# Patient Record
Sex: Male | Born: 1960 | Race: White | Hispanic: No | Marital: Married | Smoking: Current some day smoker
Health system: Southern US, Community
[De-identification: ages and names within clinical notes are randomized; demographics above are authoritative.]

## PROBLEM LIST (undated history)

## (undated) HISTORY — PX: WRIST SURGERY: SHX841

## (undated) HISTORY — PX: APPENDECTOMY: SHX54

---

## 1998-02-02 ENCOUNTER — Ambulatory Visit (HOSPITAL_COMMUNITY): Admission: RE | Admit: 1998-02-02 | Discharge: 1998-02-02 | Payer: Self-pay | Admitting: Orthopedic Surgery

## 1998-12-13 ENCOUNTER — Ambulatory Visit (HOSPITAL_COMMUNITY): Admission: RE | Admit: 1998-12-13 | Discharge: 1998-12-13 | Payer: Self-pay | Admitting: Orthopedic Surgery

## 1998-12-13 ENCOUNTER — Encounter: Payer: Self-pay | Admitting: Orthopedic Surgery

## 1999-01-14 ENCOUNTER — Emergency Department (HOSPITAL_COMMUNITY): Admission: EM | Admit: 1999-01-14 | Discharge: 1999-01-14 | Payer: Self-pay | Admitting: Emergency Medicine

## 1999-11-19 ENCOUNTER — Emergency Department (HOSPITAL_COMMUNITY): Admission: EM | Admit: 1999-11-19 | Discharge: 1999-11-19 | Payer: Self-pay | Admitting: Emergency Medicine

## 2001-09-26 ENCOUNTER — Emergency Department (HOSPITAL_COMMUNITY): Admission: EM | Admit: 2001-09-26 | Discharge: 2001-09-26 | Payer: Self-pay | Admitting: Emergency Medicine

## 2003-11-08 ENCOUNTER — Observation Stay (HOSPITAL_COMMUNITY): Admission: EM | Admit: 2003-11-08 | Discharge: 2003-11-09 | Payer: Self-pay

## 2008-04-17 ENCOUNTER — Emergency Department (HOSPITAL_BASED_OUTPATIENT_CLINIC_OR_DEPARTMENT_OTHER): Admission: EM | Admit: 2008-04-17 | Discharge: 2008-04-17 | Payer: Self-pay | Admitting: Emergency Medicine

## 2008-10-12 ENCOUNTER — Encounter: Admission: RE | Admit: 2008-10-12 | Discharge: 2008-10-12 | Payer: Self-pay | Admitting: Geriatric Medicine

## 2014-02-08 ENCOUNTER — Encounter (HOSPITAL_BASED_OUTPATIENT_CLINIC_OR_DEPARTMENT_OTHER): Payer: Self-pay | Admitting: Emergency Medicine

## 2014-02-08 ENCOUNTER — Emergency Department (HOSPITAL_BASED_OUTPATIENT_CLINIC_OR_DEPARTMENT_OTHER)
Admission: EM | Admit: 2014-02-08 | Discharge: 2014-02-08 | Disposition: A | Discharge status: Home / Self Care | Payer: BC Managed Care – PPO | Attending: Emergency Medicine | Admitting: Emergency Medicine

## 2014-02-08 ENCOUNTER — Emergency Department (HOSPITAL_BASED_OUTPATIENT_CLINIC_OR_DEPARTMENT_OTHER): Payer: BC Managed Care – PPO

## 2014-02-08 DIAGNOSIS — Z792 Long term (current) use of antibiotics: Secondary | ICD-10-CM | POA: Insufficient documentation

## 2014-02-08 DIAGNOSIS — L089 Local infection of the skin and subcutaneous tissue, unspecified: Secondary | ICD-10-CM

## 2014-02-08 DIAGNOSIS — F17200 Nicotine dependence, unspecified, uncomplicated: Secondary | ICD-10-CM | POA: Insufficient documentation

## 2014-02-08 DIAGNOSIS — L988 Other specified disorders of the skin and subcutaneous tissue: Secondary | ICD-10-CM | POA: Insufficient documentation

## 2014-02-08 MED ORDER — MELOXICAM 15 MG PO TABS: 15.0000 mg | ORAL_TABLET | Freq: Every day | ORAL | Status: AC

## 2014-02-08 MED ORDER — CIPROFLOXACIN HCL 500 MG PO TABS: 500.0000 mg | ORAL_TABLET | Freq: Two times a day (BID) | ORAL | Status: AC

## 2014-02-08 MED ORDER — KETOROLAC TROMETHAMINE 60 MG/2ML IM SOLN
60.0000 mg | Freq: Once | INTRAMUSCULAR | Status: AC
  Administered 2014-02-08: 60 mg via INTRAMUSCULAR
  Filled 2014-02-08: qty 2

## 2014-02-08 MED ORDER — CIPROFLOXACIN HCL 500 MG PO TABS
500.0000 mg | ORAL_TABLET | Freq: Once | ORAL | Status: AC
  Administered 2014-02-08: 500 mg via ORAL
  Filled 2014-02-08: qty 1

## 2014-02-08 MED ORDER — TRAMADOL HCL 50 MG PO TABS: 50.0000 mg | ORAL_TABLET | Freq: Four times a day (QID) | ORAL | Status: AC | PRN

## 2014-02-08 NOTE — ED Notes (Signed)
Rolled his right great toe over a week ago at the beach while playing volleyball.  Progressive redness, swelling, and pain.  Seen by PMD yesterday who did an I and D, prescribed antibiotic.  Redness, pain is progressing and he has increased concern.

## 2014-02-08 NOTE — ED Notes (Signed)
D/c home with rx x 3- pt has a ride

## 2014-02-08 NOTE — ED Provider Notes (Signed)
CSN: [id number]     Arrival date & time 02/08/14  0029 History   First MD Initiated Contact with Patient 02/08/14 0043     Chief Complaint  Patient presents with  . Foot Pain     (Consider location/radiation/quality/duration/timing/severity/associated sxs/prior Treatment) Patient is a 53 y.o. male presenting with lower extremity pain. The history is provided by the patient. No language interpreter was used.  Foot Pain This is a new problem. The current episode started more than 1 week ago. The problem has been gradually worsening. Pertinent negatives include no chest pain, no abdominal pain, no headaches and no shortness of breath. Nothing aggravates the symptoms. Nothing relieves the symptoms. Treatments tried: ibuprofen and keflex x 24 hours. The treatment provided no relief.  States right great tow pain started over a week ago at beach no openings in the skin he is aware of.  Seen at [facility] yesterday and had paronychia or great toe drained and started on keflex.  States swelling and pain is worse  History reviewed. No pertinent past medical history. Past Surgical History  Procedure Laterality Date  . Wrist surgery    . Appendectomy     No family history on file. History  Substance Use Topics  . Smoking status: Current Some Day Smoker  . Smokeless tobacco: Not on file  . Alcohol Use: Yes    Review of Systems  Constitutional: Negative for fever.  Respiratory: Negative for shortness of breath.   Cardiovascular: Negative for chest pain.  Gastrointestinal: Negative for abdominal pain.  Neurological: Negative for headaches.  All other systems reviewed and are negative.     Allergies  Codeine and Oxycodone  Home Medications   Prior to Admission medications   Medication Sig Start Date End Date Taking? Authorizing Provider  cephALEXin (KEFLEX) 500 MG capsule Take 500 mg by mouth 3 (three) times daily.   Yes Historical Provider, MD   BP 164/102  Pulse 77  Temp(Src) 97.6 F  (36.4 C) (Oral)  Resp 16  Ht 5\' 9"  (1.753 m)  Wt 190 lb (86.183 kg)  BMI 28.05 kg/m2  SpO2 98% Physical Exam  Constitutional: He is oriented to person, place, and time. He appears well-developed and well-nourished. No distress.  HENT:  Head: Normocephalic and atraumatic.  Mouth/Throat: Oropharynx is clear and moist.  Eyes: Conjunctivae are normal. Pupils are equal, round, and reactive to light.  Neck: Normal range of motion. Neck supple.  Cardiovascular: Normal rate, regular rhythm and intact distal pulses.   Pulmonary/Chest: Effort normal. He has no wheezes. He has no rales.  Abdominal: Soft. Bowel sounds are normal. There is no tenderness. There is no rebound and no guarding.  Musculoskeletal: Normal range of motion.       Right foot: He exhibits no bony tenderness, no swelling, normal capillary refill, no crepitus, no deformity and no laceration.       Feet:  No fluctuance no streaking.  No paronychia  Neurological: He is alert and oriented to person, place, and time. He has normal reflexes.  Skin: Skin is warm and dry.  Psychiatric: He has a normal mood and affect.    ED Course  Procedures (including critical care time) Labs Review Labs Reviewed - No data to display  Imaging Review Dg Foot Complete Right  02/08/2014   CLINICAL DATA:  Right foot redness, swelling, pain  EXAM: RIGHT FOOT COMPLETE - 3+ VIEW  COMPARISON:  None.  FINDINGS: No fracture or dislocation is seen.  Mild degenerative changes of  the 1st MTP joint.  Mild soft tissue swelling along the medial aspect of the 1st digit.  IMPRESSION: No acute osseous abnormality is seen.   Electronically Signed   By: Julian Hy M.D.   On: 02/08/2014 02:08     EKG Interpretation None      MDM   Final diagnoses:  None    Patient had paronychia drained yesterday at [facility].  Culture apparently sent. Nothing to drain today minimal warmth and swelling at lateral first MTP.   Patient placed on keflex has only had 24  hours worth at this point.  As patient was at the beach and in the water and also wearing flip flops will add cipro BID x 7 days as this will cover sand and ocean flora and pseudomonas.  Continue keflex.  Follow up with your PMD on Monday return for fevers > 101, streaking up the leg vomiting or weakness.  Have given referral to orthopedics for prn follow up.  Also return for hives or swelling or shortness of breath as tramadol is a cousin drug to percocet.  Patient states he will tolerate itching for relief of pain.  Low risk for severe allergic reaction.      Carlisle Beers, MD 02/08/14 [phone]

## 2017-09-03 DIAGNOSIS — G4733 Obstructive sleep apnea (adult) (pediatric): Secondary | ICD-10-CM | POA: Diagnosis not present

## 2017-09-28 DIAGNOSIS — L821 Other seborrheic keratosis: Secondary | ICD-10-CM | POA: Diagnosis not present

## 2017-09-28 DIAGNOSIS — L7211 Pilar cyst: Secondary | ICD-10-CM | POA: Diagnosis not present

## 2017-09-28 DIAGNOSIS — Z85828 Personal history of other malignant neoplasm of skin: Secondary | ICD-10-CM | POA: Diagnosis not present

## 2018-02-18 DIAGNOSIS — G4733 Obstructive sleep apnea (adult) (pediatric): Secondary | ICD-10-CM | POA: Diagnosis not present

## 2018-05-02 DIAGNOSIS — Z86010 Personal history of colonic polyps: Secondary | ICD-10-CM | POA: Diagnosis not present

## 2018-05-02 DIAGNOSIS — K5730 Diverticulosis of large intestine without perforation or abscess without bleeding: Secondary | ICD-10-CM | POA: Diagnosis not present

## 2018-05-02 DIAGNOSIS — D126 Benign neoplasm of colon, unspecified: Secondary | ICD-10-CM | POA: Diagnosis not present

## 2018-05-18 DIAGNOSIS — Z6827 Body mass index (BMI) 27.0-27.9, adult: Secondary | ICD-10-CM | POA: Diagnosis not present

## 2018-05-18 DIAGNOSIS — H109 Unspecified conjunctivitis: Secondary | ICD-10-CM | POA: Diagnosis not present

## 2018-06-13 DIAGNOSIS — I10 Essential (primary) hypertension: Secondary | ICD-10-CM | POA: Diagnosis not present

## 2018-06-13 DIAGNOSIS — M10472 Other secondary gout, left ankle and foot: Secondary | ICD-10-CM | POA: Diagnosis not present

## 2018-06-13 DIAGNOSIS — E785 Hyperlipidemia, unspecified: Secondary | ICD-10-CM | POA: Diagnosis not present

## 2018-08-19 ENCOUNTER — Other Ambulatory Visit: Payer: Self-pay | Admitting: Nephrology

## 2018-08-19 DIAGNOSIS — R319 Hematuria, unspecified: Secondary | ICD-10-CM

## 2018-08-19 DIAGNOSIS — N183 Chronic kidney disease, stage 3 unspecified: Secondary | ICD-10-CM

## 2018-08-26 ENCOUNTER — Other Ambulatory Visit: Payer: Self-pay

## 2018-08-27 ENCOUNTER — Ambulatory Visit
Admission: RE | Admit: 2018-08-27 | Discharge: 2018-08-27 | Disposition: A | Discharge status: Home / Self Care | Payer: Self-pay | Source: Ambulatory Visit | Attending: Nephrology | Admitting: Nephrology

## 2018-08-27 DIAGNOSIS — N183 Chronic kidney disease, stage 3 unspecified: Secondary | ICD-10-CM

## 2018-08-27 DIAGNOSIS — R319 Hematuria, unspecified: Secondary | ICD-10-CM

## 2018-08-29 ENCOUNTER — Other Ambulatory Visit: Payer: Self-pay

## 2019-01-08 IMAGING — US US RENAL
1 series · 14 of 25 positions shown · non-contrast
Comparison: CT 04/17/2008

CLINICAL DATA: Chronic kidney disease with hematuria

EXAM:
RENAL / URINARY TRACT ULTRASOUND COMPLETE

[Series 1: us renal · 0.25mm/px · 14 of 39 slices shown]
[im 1/39]
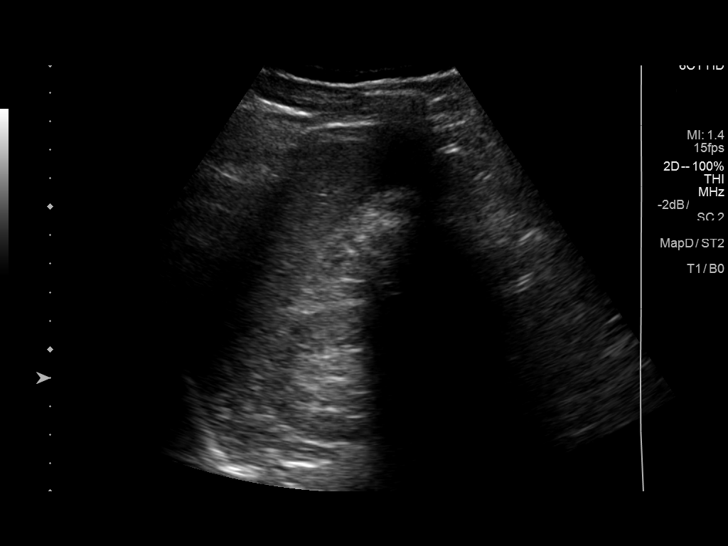
[im 4/39]
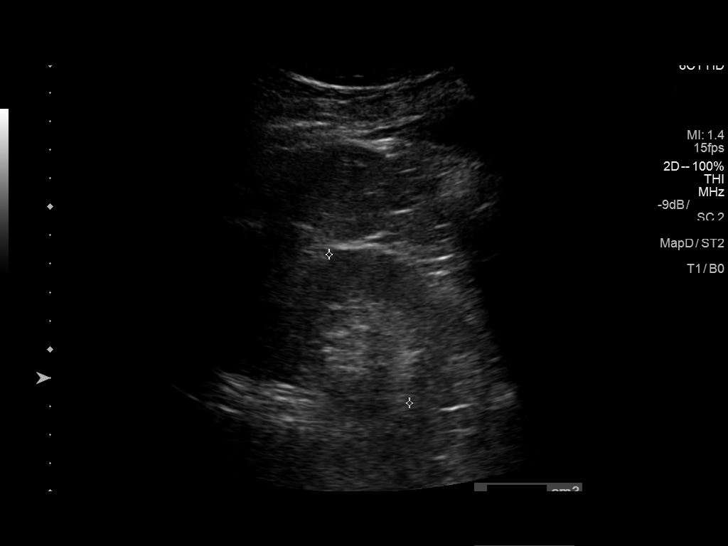
[im 7/39]
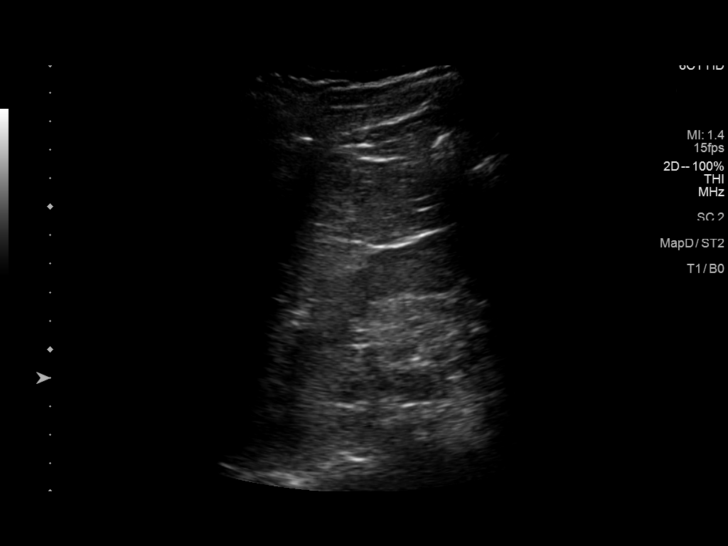
[im 10/39]
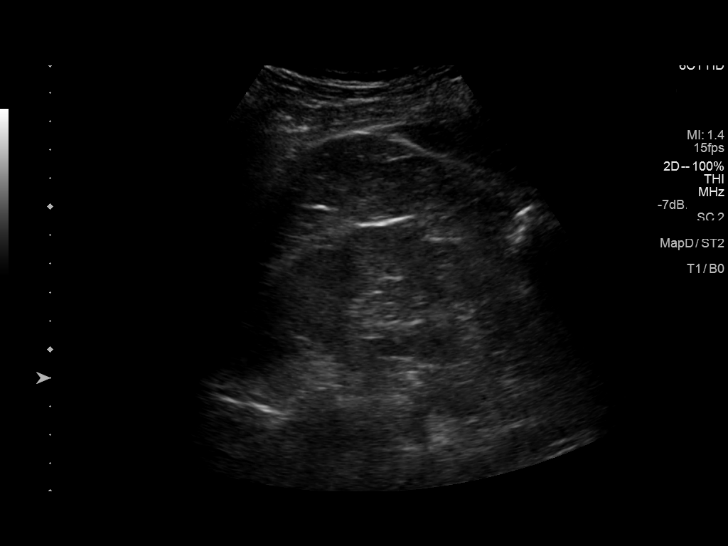
[im 13/39]
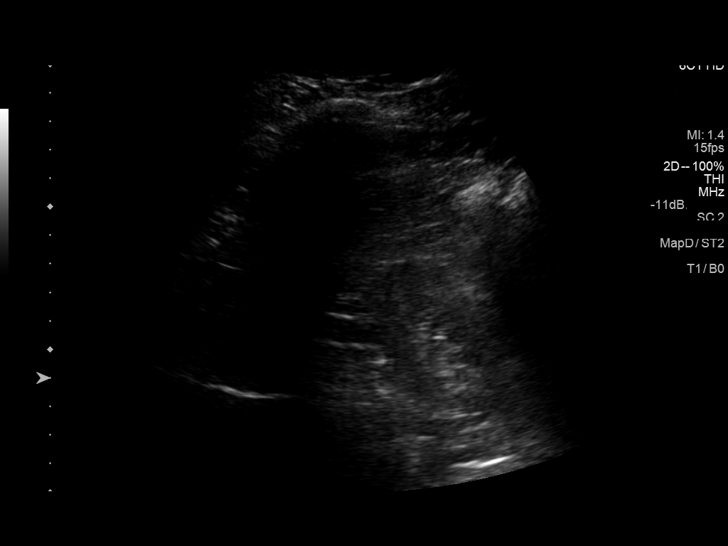
[im 15/39]
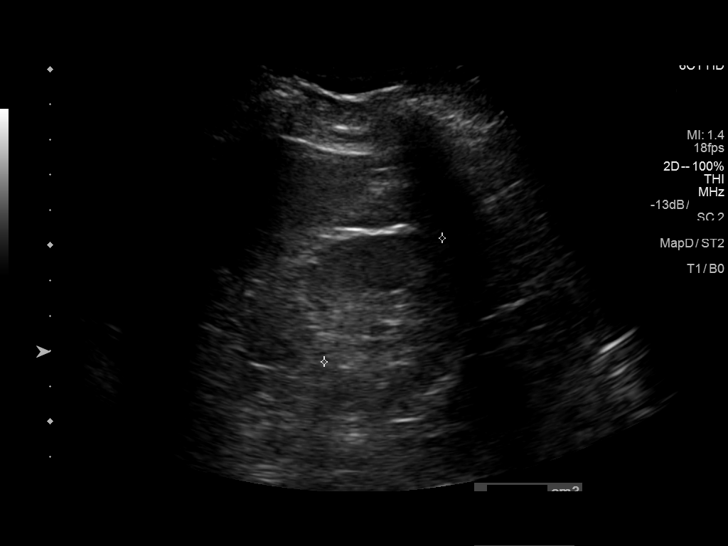
[im 18/39]
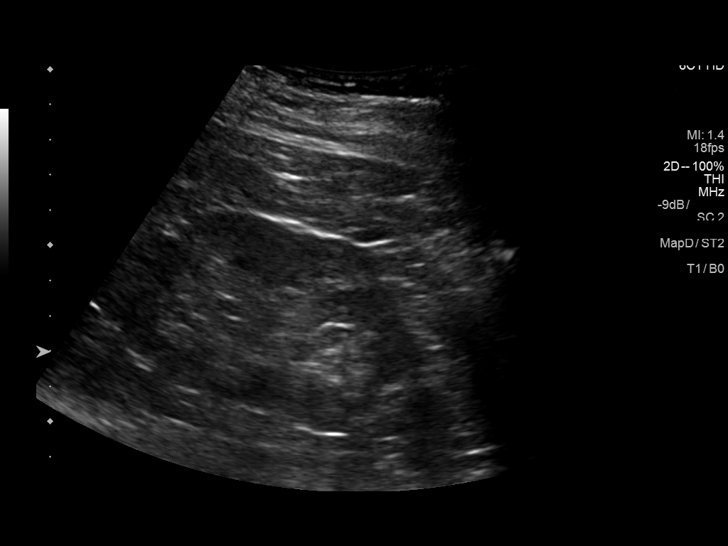
[im 21/39]
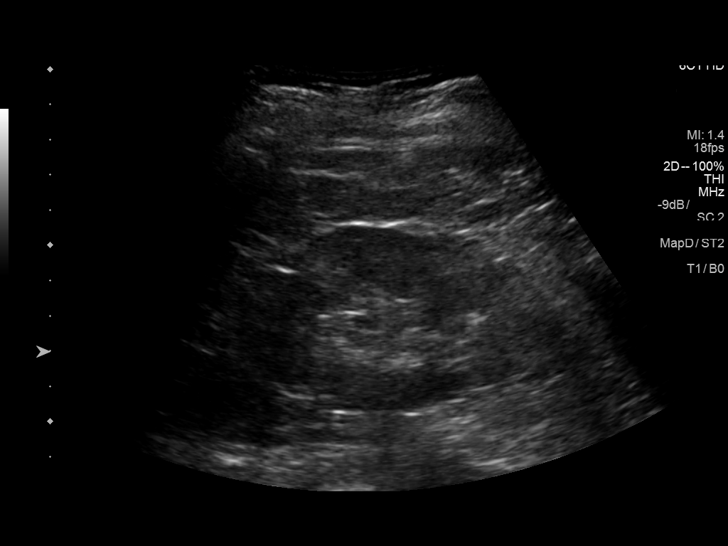
[im 24/39]
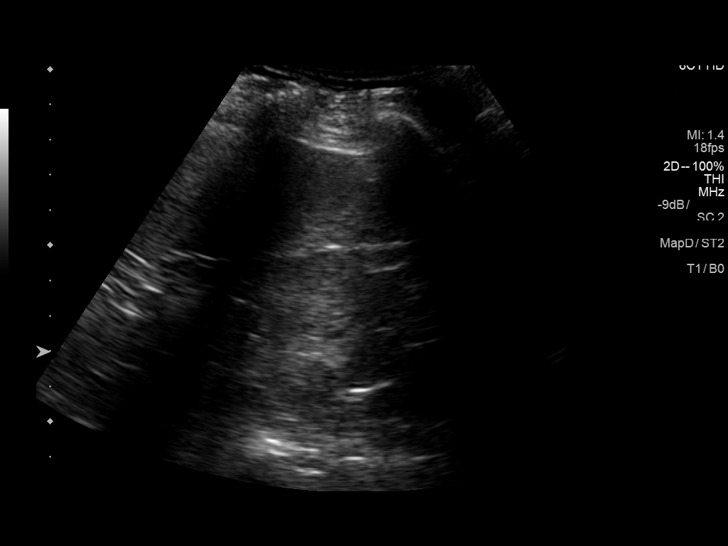
[im 26/39]
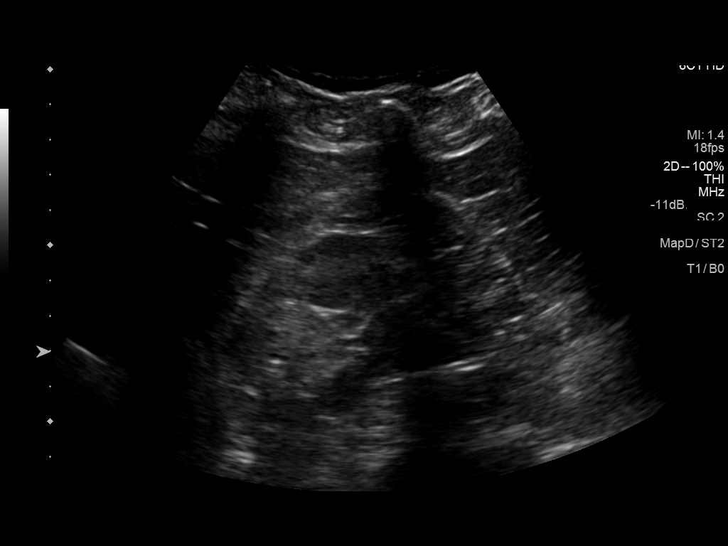
[im 29/39]
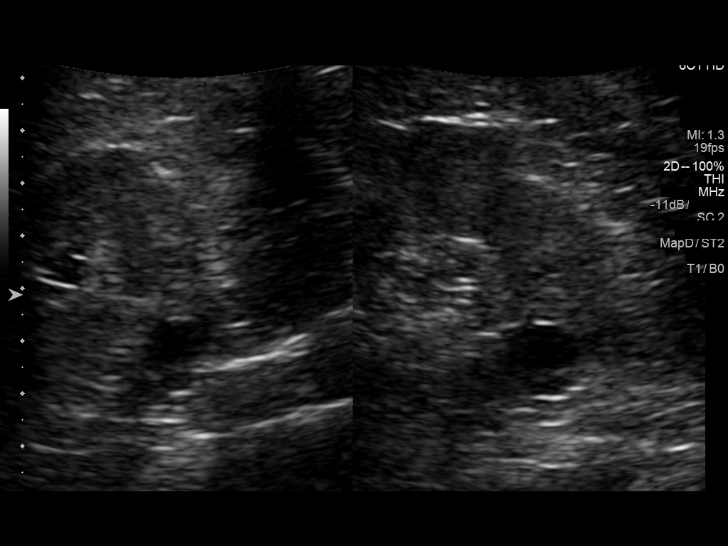
[im 32/39]
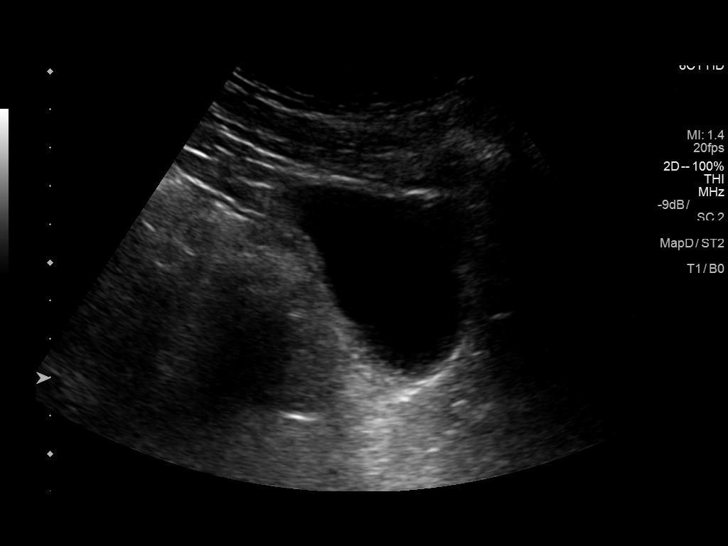
[im 35/39]
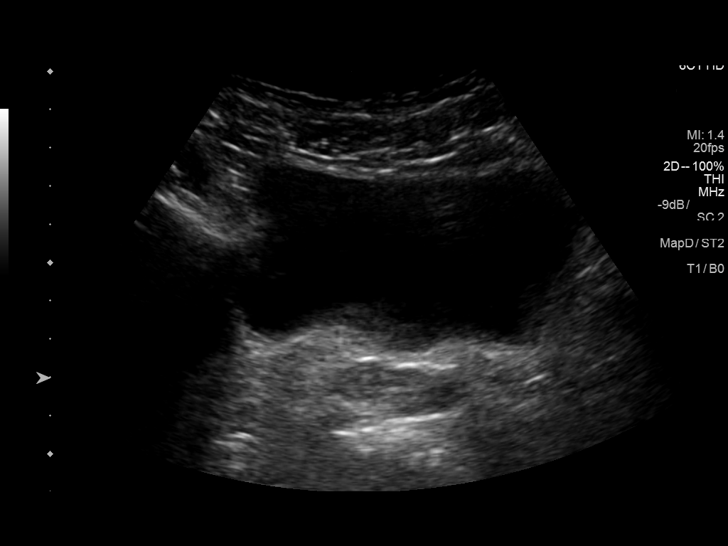
[im 39/39]
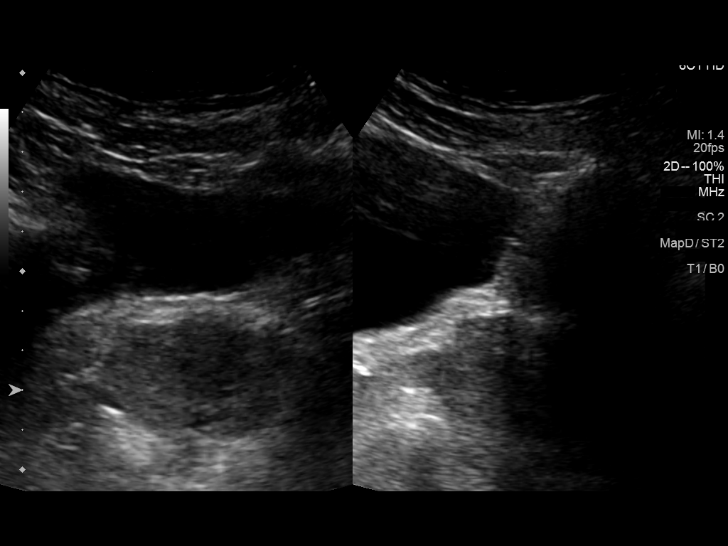

[14 of 25 positions shown; findings below may reference images not displayed]

FINDINGS: Right Kidney:

Renal measurements: 10.1 cm length by 5.8 cm height by 5.9 cm wide =
volume: 181 mL. Increased cortical echogenicity. No hydronephrosis.
No focal abnormality.

Left Kidney:

Renal measurements: 9.8 cm length by 5.9 cm height by 4.9 cm wide =
volume: 145 mL. Increased cortical echogenicity. No hydronephrosis.
Small cyst lower pole measuring 1.3 x 0.9 x 1.4 cm.

Bladder:

Appears normal for degree of bladder distention. Prostate appears
enlarged.
IMPRESSION: 1. Increased cortical echogenicity suggesting medical renal disease.
No hydronephrosis
2. Small cyst lower pole left kidney
3. Prostatomegaly

## 2019-07-25 ENCOUNTER — Other Ambulatory Visit (HOSPITAL_COMMUNITY): Payer: Self-pay | Admitting: Nephrology

## 2019-07-25 DIAGNOSIS — R319 Hematuria, unspecified: Secondary | ICD-10-CM

## 2019-07-25 DIAGNOSIS — N059 Unspecified nephritic syndrome with unspecified morphologic changes: Secondary | ICD-10-CM

## 2019-07-25 DIAGNOSIS — R809 Proteinuria, unspecified: Secondary | ICD-10-CM

## 2019-08-15 ENCOUNTER — Ambulatory Visit (HOSPITAL_COMMUNITY): Payer: 59

## 2019-08-15 ENCOUNTER — Encounter (HOSPITAL_COMMUNITY): Payer: Self-pay

## 2019-09-03 ENCOUNTER — Other Ambulatory Visit: Payer: Self-pay | Admitting: Student

## 2019-09-03 ENCOUNTER — Other Ambulatory Visit: Payer: Self-pay | Admitting: Radiology

## 2019-09-04 ENCOUNTER — Ambulatory Visit (HOSPITAL_COMMUNITY)
Admission: RE | Admit: 2019-09-04 | Discharge: 2019-09-04 | Disposition: A | Discharge status: Home / Self Care | Payer: 59 | Source: Ambulatory Visit | Attending: Nephrology | Admitting: Nephrology

## 2019-09-04 ENCOUNTER — Telehealth: Payer: Self-pay | Admitting: Nephrology

## 2019-09-04 ENCOUNTER — Other Ambulatory Visit: Payer: Self-pay

## 2019-09-04 DIAGNOSIS — Z792 Long term (current) use of antibiotics: Secondary | ICD-10-CM | POA: Diagnosis not present

## 2019-09-04 DIAGNOSIS — Z885 Allergy status to narcotic agent status: Secondary | ICD-10-CM | POA: Diagnosis not present

## 2019-09-04 DIAGNOSIS — R319 Hematuria, unspecified: Secondary | ICD-10-CM

## 2019-09-04 DIAGNOSIS — Z791 Long term (current) use of non-steroidal anti-inflammatories (NSAID): Secondary | ICD-10-CM | POA: Diagnosis not present

## 2019-09-04 DIAGNOSIS — N059 Unspecified nephritic syndrome with unspecified morphologic changes: Secondary | ICD-10-CM | POA: Diagnosis not present

## 2019-09-04 DIAGNOSIS — Z79899 Other long term (current) drug therapy: Secondary | ICD-10-CM | POA: Insufficient documentation

## 2019-09-04 DIAGNOSIS — R809 Proteinuria, unspecified: Secondary | ICD-10-CM

## 2019-09-04 DIAGNOSIS — N1832 Chronic kidney disease, stage 3b: Secondary | ICD-10-CM | POA: Insufficient documentation

## 2019-09-04 DIAGNOSIS — F17200 Nicotine dependence, unspecified, uncomplicated: Secondary | ICD-10-CM | POA: Insufficient documentation

## 2019-09-04 LAB — CBC
HCT: 45 % (ref 39.0–52.0)
Hemoglobin: 15.2 g/dL (ref 13.0–17.0)
MCH: 28.2 pg (ref 26.0–34.0)
MCHC: 33.8 g/dL (ref 30.0–36.0)
MCV: 83.5 fL (ref 80.0–100.0)
Platelets: 277 10*3/uL (ref 150–400)
RBC: 5.39 MIL/uL (ref 4.22–5.81)
RDW: 12.4 % (ref 11.5–15.5)
WBC: 8.6 10*3/uL (ref 4.0–10.5)
nRBC: 0 % (ref 0.0–0.2)

## 2019-09-04 LAB — PROTIME-INR
INR: 1 (ref 0.8–1.2)
Prothrombin Time: 12.7 seconds (ref 11.4–15.2)

## 2019-09-04 LAB — BASIC METABOLIC PANEL
Anion gap: 6 (ref 5–15)
BUN: 40 mg/dL — ABNORMAL HIGH (ref 6–20)
CO2: 26 mmol/L (ref 22–32)
Calcium: 9.1 mg/dL (ref 8.9–10.3)
Chloride: 105 mmol/L (ref 98–111)
Creatinine, Ser: 2.11 mg/dL — ABNORMAL HIGH (ref 0.61–1.24)
GFR calc Af Amer: 39 mL/min — ABNORMAL LOW (ref >60)
GFR calc non Af Amer: 34 mL/min — ABNORMAL LOW (ref >60)
Glucose, Bld: 98 mg/dL (ref 70–99)
Potassium: 4.3 mmol/L (ref 3.5–5.1)
Sodium: 137 mmol/L (ref 135–145)

## 2019-09-04 MED ORDER — LIDOCAINE HCL (PF) 1 % IJ SOLN
INTRAMUSCULAR | Status: AC
  Filled 2019-09-04: qty 30

## 2019-09-04 MED ORDER — FENTANYL CITRATE (PF) 100 MCG/2ML IJ SOLN
INTRAMUSCULAR | Status: AC
  Filled 2019-09-04: qty 2

## 2019-09-04 MED ORDER — SODIUM CHLORIDE 0.9 % IV SOLN: INTRAVENOUS | Status: DC

## 2019-09-04 MED ORDER — GELATIN ABSORBABLE 12-7 MM EX MISC
CUTANEOUS | Status: AC
  Filled 2019-09-04: qty 1

## 2019-09-04 MED ORDER — MIDAZOLAM HCL 2 MG/2ML IJ SOLN
INTRAMUSCULAR | Status: AC
  Filled 2019-09-04: qty 2

## 2019-09-04 MED ORDER — FENTANYL CITRATE (PF) 100 MCG/2ML IJ SOLN
INTRAMUSCULAR | Status: AC | PRN
  Administered 2019-09-04 (×3): 50 ug via INTRAVENOUS

## 2019-09-04 MED ORDER — MIDAZOLAM HCL 2 MG/2ML IJ SOLN
INTRAMUSCULAR | Status: AC | PRN
  Administered 2019-09-04 (×3): 1 mg via INTRAVENOUS

## 2019-09-04 NOTE — H&P (Signed)
Chief Complaint: Patient was seen in consultation today for renal biopsy at the request of [place]  Referring Physician(s): Coladonato,Joseph  Supervising Physician: Markus Daft  Patient Status: [facility] - Out-pt  History of Present Illness: Bradley Gregory is a 59 y.o. male being worked up for proteinuria and glomerulonephritis. He is referred for random renal biopsy  No past medical history on file.  Past Surgical History:  Procedure Laterality Date  . APPENDECTOMY    . WRIST SURGERY      Allergies: Codeine and Oxycodone  Medications: Prior to Admission medications   Medication Sig Start Date End Date Taking? Authorizing Provider  atorvastatin (LIPITOR) 10 MG tablet Take 10 mg by mouth daily.   Yes [provider]  losartan (COZAAR) 50 MG tablet Take 50 mg by mouth daily.   Yes [provider]  cephALEXin (KEFLEX) 500 MG capsule Take 500 mg by mouth 3 (three) times daily.    [provider]  ciprofloxacin (CIPRO) 500 MG tablet Take 1 tablet (500 mg total) by mouth 2 (two) times daily. One po bid x 7 days 02/08/14   Palumbo, April, MD  meloxicam (MOBIC) 15 MG tablet Take 1 tablet (15 mg total) by mouth daily. Take with food 02/08/14   Palumbo, April, MD  traMADol (ULTRAM) 50 MG tablet Take 1 tablet (50 mg total) by mouth every 6 (six) hours as needed for severe pain. 02/08/14   Palumbo, April, MD     No family history on file.  Social History   Socioeconomic History  . Marital status: Married    Spouse name: Not on file  . Number of children: Not on file  . Years of education: Not on file  . Highest education level: Not on file  Occupational History  . Not on file  Tobacco Use  . Smoking status: Current Some Day Smoker  Substance and Sexual Activity  . Alcohol use: Yes  . Drug use: No  . Sexual activity: Not on file  Other Topics Concern  . Not on file  Social History Narrative  . Not on file   Social Determinants of  Health   Financial Resource Strain:   . Difficulty of Paying Living Expenses: Not on file  Food Insecurity:   . Worried About Charity fundraiser in the Last Year: Not on file  . Ran Out of Food in the Last Year: Not on file  Transportation Needs:   . Lack of Transportation (Medical): Not on file  . Lack of Transportation (Non-Medical): Not on file  Physical Activity:   . Days of Exercise per Week: Not on file  . Minutes of Exercise per Session: Not on file  Stress:   . Feeling of Stress : Not on file  Social Connections:   . Frequency of Communication with Friends and Family: Not on file  . Frequency of Social Gatherings with Friends and Family: Not on file  . Attends Religious Services: Not on file  . Active Member of Clubs or Organizations: Not on file  . Attends Archivist Meetings: Not on file  . Marital Status: Not on file     Review of Systems: A 12 point ROS discussed and pertinent positives are indicated in the HPI above.  All other systems are negative.  Review of Systems  Vital Signs: BP (!) 116/96   Pulse 80   Temp 97.8 F (36.6 C) (Skin)   Resp 16   Ht 5\' 10"  (1.778 m)  Wt 79.4 kg   SpO2 100%   BMI 25.11 kg/m   Physical Exam Constitutional:      Appearance: Normal appearance.  HENT:     Mouth/Throat:     Mouth: Mucous membranes are moist.     Pharynx: Oropharynx is clear.  Cardiovascular:     Rate and Rhythm: Normal rate and regular rhythm.     Heart sounds: Normal heart sounds.  Pulmonary:     Effort: Pulmonary effort is normal. No respiratory distress.     Breath sounds: Normal breath sounds.  Abdominal:     General: Abdomen is flat. There is no distension.     Palpations: Abdomen is soft.     Tenderness: There is no abdominal tenderness.  Skin:    General: Skin is warm and dry.  Neurological:     General: No focal deficit present.     Mental Status: He is alert and oriented to person, place, and time.  Psychiatric:        Mood  and Affect: Mood normal.        Thought Content: Thought content normal.        Judgment: Judgment normal.     Imaging: No results found.  Labs:  CBC: Recent Labs    09/04/19 0644  WBC 8.6  HGB 15.2  HCT 45.0  PLT 277    COAGS: Recent Labs    09/04/19 0644  INR 1.0    BMP: Recent Labs    09/04/19 0644  NA 137  K 4.3  CL 105  CO2 26  GLUCOSE 98  BUN 40*  CALCIUM 9.1  CREATININE 2.11*  GFRNONAA 34*  GFRAA 39*    LIVER FUNCTION TESTS: No results for input(s): BILITOT, AST, ALT, ALKPHOS, PROT, ALBUMIN in the last 8760 hours.  TUMOR MARKERS: No results for input(s): AFPTM, CEA, CA199, CHROMGRNA in the last 8760 hours.  Assessment and Plan: Proteinuria For US guided random renal biopsy Labs and BP ok Risks and benefits of renal bx was discussed with the patient and/or patient's family including, but not limited to bleeding, infection, damage to adjacent structures or low yield requiring additional tests.  All of the questions were answered and there is agreement to proceed.  Consent signed and in chart.    Thank you for this interesting consult.  I greatly enjoyed meeting JOHNCARLO LAXSON and look forward to participating in their care.  A copy of this report was sent to the requesting provider on this date.  Electronically Signed: Ascencion Dike, PA-C 09/04/2019, 7:44 AM   I spent a total of 20 minutes in face to face in clinical consultation, greater than 50% of which was counseling/coordinating care for renal biopsy

## 2019-09-04 NOTE — Discharge Instructions (Signed)
Percutaneous Kidney Biopsy, Care After This sheet gives you information about how to care for yourself after your procedure. Your health care provider may also give you more specific instructions. If you have problems or questions, contact your health care provider. What can I expect after the procedure? After the procedure, it is common to have:  Pain or soreness near the biopsy site.  Pink or cloudy urine for 24 hours after the procedure. Follow these instructions at home: Activity  Return to your normal activities as told by your health care provider. Ask your health care provider what activities are safe for you.  If you were given a sedative during the procedure, it can affect you for several hours. Do not drive or operate machinery until your health care provider says that it is safe.  Do not lift anything that is heavier than 10 lb for 1 week.  Avoid activities that take a lot of effort (are strenuous) until your health care provider approves. Most people will have to wait 2 weeks before returning to activities such as exercise or sex. General instructions   Take over-the-counter and prescription medicines only as told by your health care provider.  You may eat and drink after your procedure. Follow instructions from your health care provider about eating or drinking restrictions.  Check your biopsy site every day for signs of infection. Check for: ? More redness, swelling, or pain. ? Fluid or blood. ? Warmth. ? Pus or a bad smell.  Keep all follow-up visits as told by your health care provider. This is important. Contact a health care provider if:  You have more redness, swelling, or pain around your biopsy site.  You have fluid or blood coming from your biopsy site.  Your biopsy site feels warm to the touch.  You have pus or a bad smell coming from your biopsy site.  You have blood in your urine more than 24 hours after your procedure.  You have a fever. Get help  right away if:  Your urine is dark red or brown.  You cannot urinate.  It burns when you urinate.  You feel dizzy or light-headed.  You have severe pain in your abdomen or side. Summary  After the procedure, it is common to have pain or soreness at the biopsy site and pink or cloudy urine for the first 24 hours.  Check your biopsy site each day for signs of infection, such as more redness, swelling, or pain; fluid, blood, pus or a bad smell coming from the biopsy site; or the biopsy site feeling warm to touch.  Return to your normal activities as told by your health care provider.  This information is not intended to replace advice given to you by your health care provider. Make sure you discuss any questions you have with your health care provider. Document Revised: 04/17/2019 Document Reviewed: 04/17/2019 Elsevier Patient Education  [employer].

## 2019-09-04 NOTE — Progress Notes (Signed)
Discharge instructions reviewed with patient and wife. Verbalized understanding. 

## 2019-09-04 NOTE — Procedures (Signed)
Interventional Radiology Procedure:   Indications: CKD, stage 3   Procedure: US guided renal biopsy  Findings: 2 cores from left kidney lower pole  Complications: None     EBL: less than 10 ml  Plan: Bedrest 4 hours.     Layce Sprung R. Anselm Pancoast, MD  Pager: [phone]

## 2019-09-17 LAB — SURGICAL PATHOLOGY

## 2020-10-13 DIAGNOSIS — B360 Pityriasis versicolor: Secondary | ICD-10-CM | POA: Diagnosis not present

## 2020-10-13 DIAGNOSIS — D225 Melanocytic nevi of trunk: Secondary | ICD-10-CM | POA: Diagnosis not present

## 2020-10-13 DIAGNOSIS — L578 Other skin changes due to chronic exposure to nonionizing radiation: Secondary | ICD-10-CM | POA: Diagnosis not present

## 2020-10-13 DIAGNOSIS — L821 Other seborrheic keratosis: Secondary | ICD-10-CM | POA: Diagnosis not present

## 2020-10-19 DIAGNOSIS — N2581 Secondary hyperparathyroidism of renal origin: Secondary | ICD-10-CM | POA: Diagnosis not present

## 2020-10-19 DIAGNOSIS — I129 Hypertensive chronic kidney disease with stage 1 through stage 4 chronic kidney disease, or unspecified chronic kidney disease: Secondary | ICD-10-CM | POA: Diagnosis not present

## 2020-10-19 DIAGNOSIS — N041 Nephrotic syndrome with focal and segmental glomerular lesions: Secondary | ICD-10-CM | POA: Diagnosis not present

## 2020-10-19 DIAGNOSIS — N183 Chronic kidney disease, stage 3 unspecified: Secondary | ICD-10-CM | POA: Diagnosis not present

## 2020-10-19 DIAGNOSIS — R809 Proteinuria, unspecified: Secondary | ICD-10-CM | POA: Diagnosis not present

## 2020-10-19 DIAGNOSIS — M109 Gout, unspecified: Secondary | ICD-10-CM | POA: Diagnosis not present

## 2021-01-10 DIAGNOSIS — D631 Anemia in chronic kidney disease: Secondary | ICD-10-CM | POA: Diagnosis not present

## 2021-01-10 DIAGNOSIS — N183 Chronic kidney disease, stage 3 unspecified: Secondary | ICD-10-CM | POA: Diagnosis not present

## 2021-01-10 DIAGNOSIS — R809 Proteinuria, unspecified: Secondary | ICD-10-CM | POA: Diagnosis not present

## 2021-01-10 DIAGNOSIS — E785 Hyperlipidemia, unspecified: Secondary | ICD-10-CM | POA: Diagnosis not present

## 2021-01-10 DIAGNOSIS — I129 Hypertensive chronic kidney disease with stage 1 through stage 4 chronic kidney disease, or unspecified chronic kidney disease: Secondary | ICD-10-CM | POA: Diagnosis not present

## 2021-01-10 DIAGNOSIS — M109 Gout, unspecified: Secondary | ICD-10-CM | POA: Diagnosis not present

## 2021-03-16 DIAGNOSIS — L247 Irritant contact dermatitis due to plants, except food: Secondary | ICD-10-CM | POA: Diagnosis not present

## 2021-04-13 IMAGING — US US BIOPSY
1 series · 10 of 10 positions shown · non-contrast
Comparison: none

INDICATION: 58-year-old with chronic kidney disease, stage III.
Glomerulonephritis. Request for random renal biopsy.

[Series 1: us biopsy · 10 of 10 slices shown]
[im 1/10]
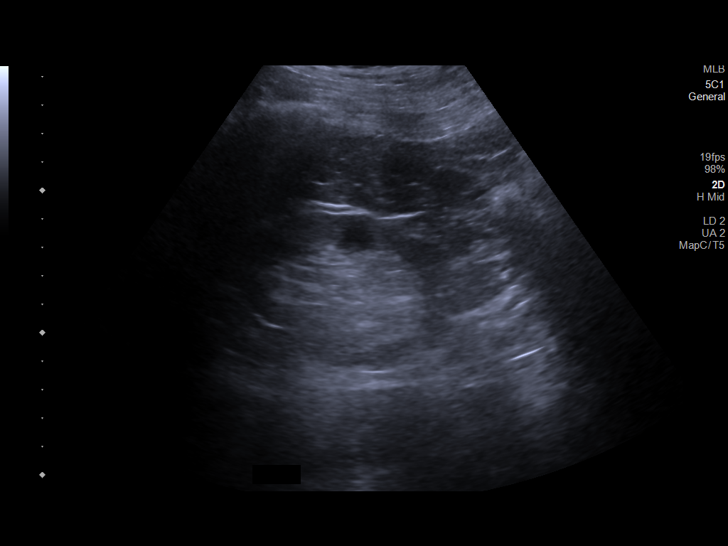
[im 2/10]
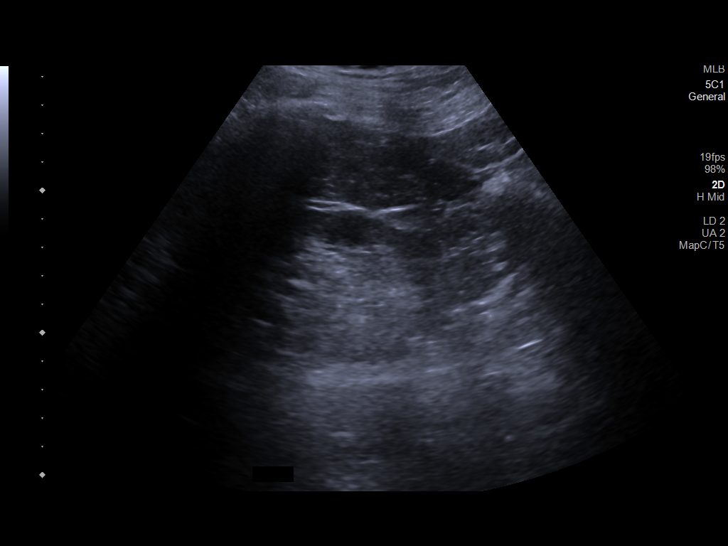
[im 3/10]
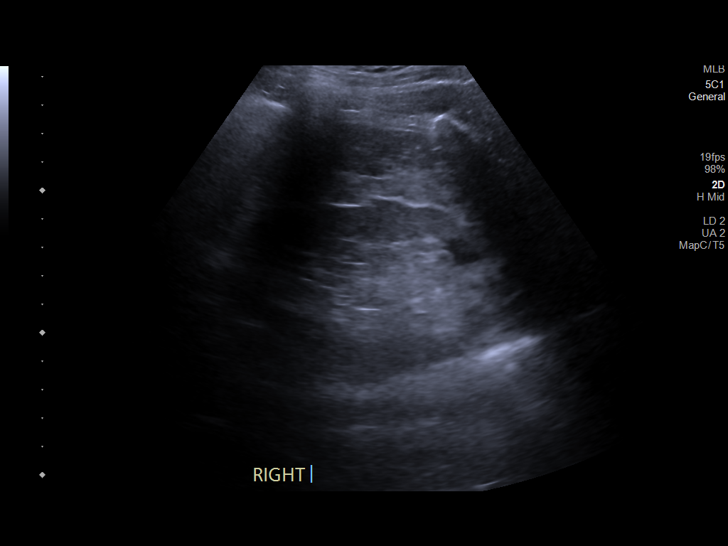
[im 4/10]
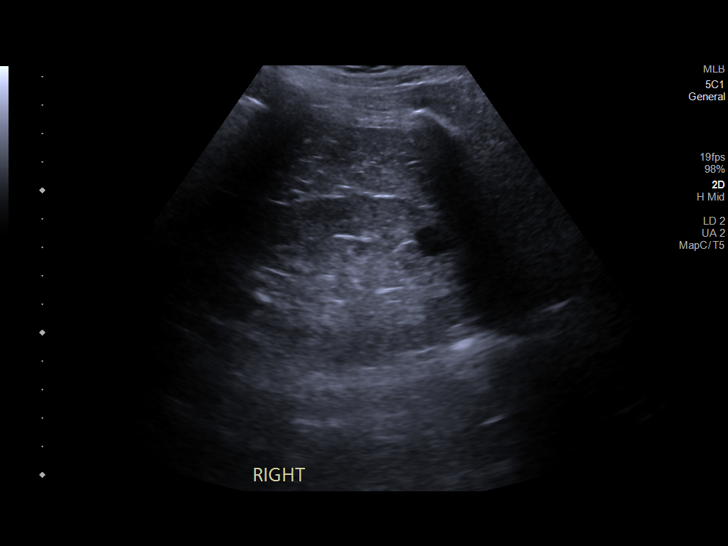
[im 5/10]
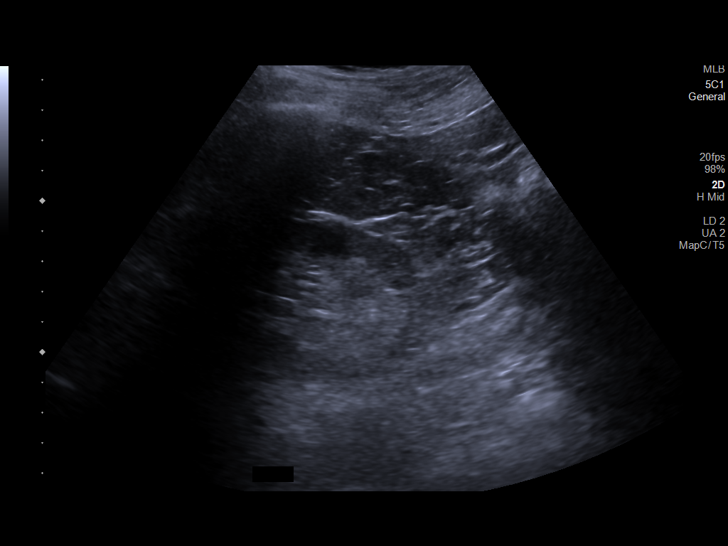
[im 6/10]
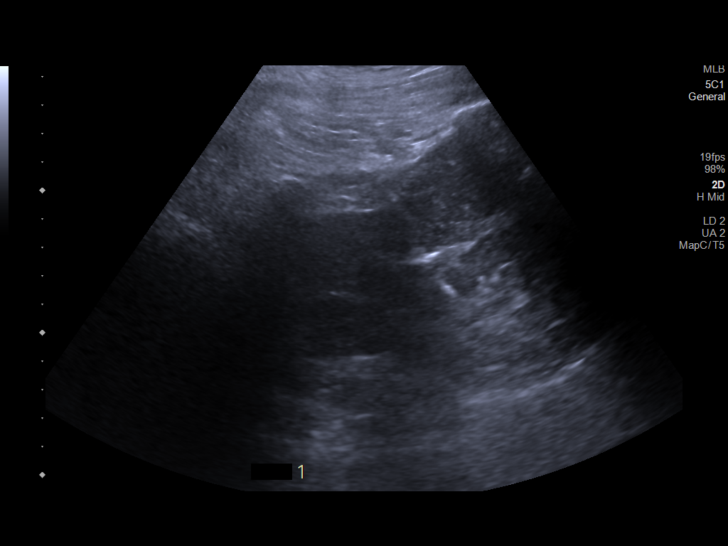
[im 7/10]
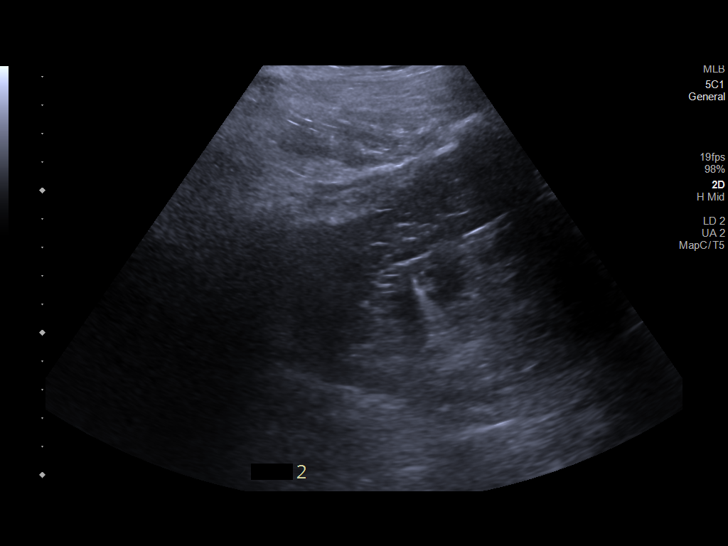
[im 8/10]
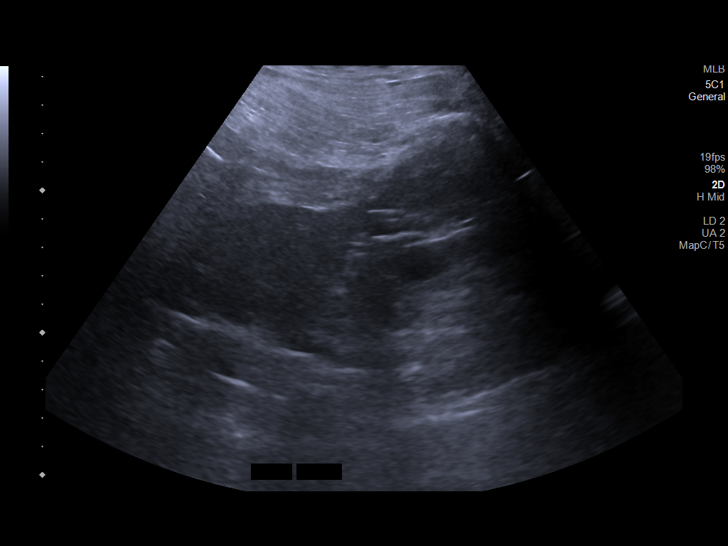
[im 9/10]
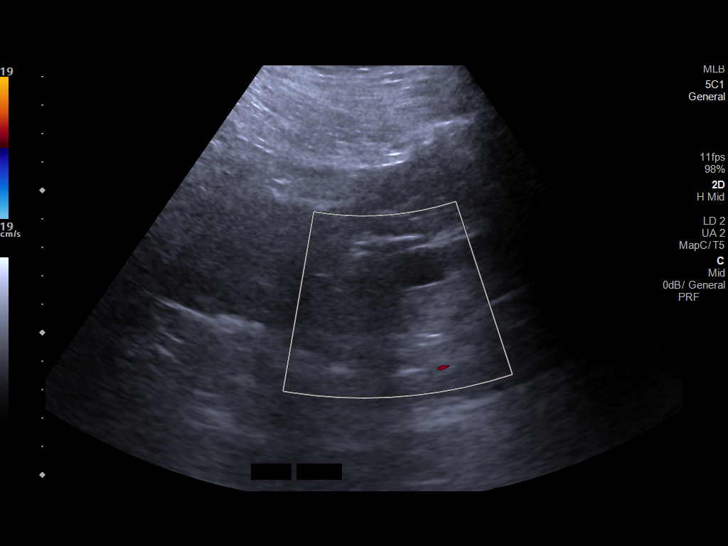
[im 10/10]
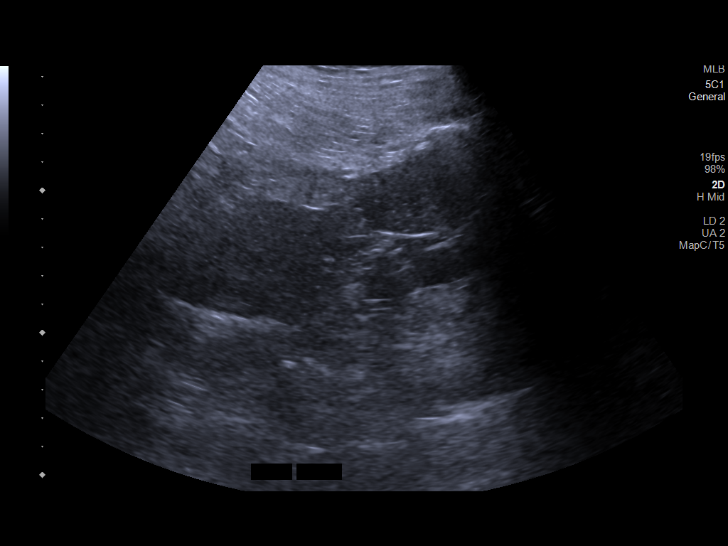

[10 of 10 positions shown; findings below may reference images not displayed]

EXAM:
ULTRASOUND-GUIDED LEFT RENAL BIOPSY

MEDICATIONS:
None.

ANESTHESIA/SEDATION:
Moderate (conscious) sedation was employed during this procedure. A
total of Versed 3.0 mg and Fentanyl 150 mcg was administered
intravenously.

Moderate Sedation Time: 49 minutes. The patient's level of
consciousness and vital signs were monitored continuously by
radiology nursing throughout the procedure under my direct
supervision.

FLUOROSCOPY TIME:  None

COMPLICATIONS:
None immediate.

PROCEDURE:
Informed written consent was obtained from the patient after a
thorough discussion of the procedural risks, benefits and
alternatives. All questions were addressed. Maximal Sterile Barrier
Technique was utilized including caps, mask, sterile gowns, sterile
gloves, sterile drape, hand hygiene and skin antiseptic. A timeout
was performed prior to the initiation of the procedure.

Both kidneys were evaluated with ultrasound. Patient was placed
prone. The back was prepped and draped in sterile fashion. Skin was
anesthetized with 1% lidocaine. Using ultrasound guidance, a 16
gauge core needle was directed into the left kidney lower pole. Core
biopsy obtained and placed in saline. A second core biopsy obtained
from the left kidney lower pole using ultrasound guidance. Second
core biopsy placed in saline. Bandage placed over the puncture site.
FINDINGS: Cortical thinning in both kidneys and both kidneys are difficult to
visualize due to overlying ribs and adjacent bowel. In addition,
patient has small cortical cysts. Two cores obtained from the left
kidney lower pole. No significant hematoma formation following the
core biopsies. Two adequate specimens obtained.
IMPRESSION: Ultrasound-guided core biopsies from the left kidney lower pole.

## 2021-04-15 DIAGNOSIS — M109 Gout, unspecified: Secondary | ICD-10-CM | POA: Diagnosis not present

## 2021-04-15 DIAGNOSIS — R809 Proteinuria, unspecified: Secondary | ICD-10-CM | POA: Diagnosis not present

## 2021-04-15 DIAGNOSIS — N2581 Secondary hyperparathyroidism of renal origin: Secondary | ICD-10-CM | POA: Diagnosis not present

## 2021-04-15 DIAGNOSIS — I129 Hypertensive chronic kidney disease with stage 1 through stage 4 chronic kidney disease, or unspecified chronic kidney disease: Secondary | ICD-10-CM | POA: Diagnosis not present

## 2021-04-15 DIAGNOSIS — D631 Anemia in chronic kidney disease: Secondary | ICD-10-CM | POA: Diagnosis not present

## 2021-04-15 DIAGNOSIS — N041 Nephrotic syndrome with focal and segmental glomerular lesions: Secondary | ICD-10-CM | POA: Diagnosis not present

## 2021-04-15 DIAGNOSIS — N183 Chronic kidney disease, stage 3 unspecified: Secondary | ICD-10-CM | POA: Diagnosis not present

## 2021-06-07 DIAGNOSIS — M2550 Pain in unspecified joint: Secondary | ICD-10-CM | POA: Diagnosis not present

## 2021-07-14 DIAGNOSIS — I129 Hypertensive chronic kidney disease with stage 1 through stage 4 chronic kidney disease, or unspecified chronic kidney disease: Secondary | ICD-10-CM | POA: Diagnosis not present

## 2021-07-14 DIAGNOSIS — D631 Anemia in chronic kidney disease: Secondary | ICD-10-CM | POA: Diagnosis not present

## 2021-07-14 DIAGNOSIS — R809 Proteinuria, unspecified: Secondary | ICD-10-CM | POA: Diagnosis not present

## 2021-07-14 DIAGNOSIS — N183 Chronic kidney disease, stage 3 unspecified: Secondary | ICD-10-CM | POA: Diagnosis not present

## 2021-09-19 DIAGNOSIS — M1A09X0 Idiopathic chronic gout, multiple sites, without tophus (tophi): Secondary | ICD-10-CM | POA: Diagnosis not present

## 2021-09-19 DIAGNOSIS — M7989 Other specified soft tissue disorders: Secondary | ICD-10-CM | POA: Diagnosis not present

## 2021-09-19 DIAGNOSIS — R5383 Other fatigue: Secondary | ICD-10-CM | POA: Diagnosis not present

## 2021-09-19 DIAGNOSIS — M130 Polyarthritis, unspecified: Secondary | ICD-10-CM | POA: Diagnosis not present

## 2021-09-19 DIAGNOSIS — R808 Other proteinuria: Secondary | ICD-10-CM | POA: Diagnosis not present

## 2021-09-27 DIAGNOSIS — M130 Polyarthritis, unspecified: Secondary | ICD-10-CM | POA: Diagnosis not present

## 2021-09-27 DIAGNOSIS — M1A09X0 Idiopathic chronic gout, multiple sites, without tophus (tophi): Secondary | ICD-10-CM | POA: Diagnosis not present

## 2021-09-27 DIAGNOSIS — R808 Other proteinuria: Secondary | ICD-10-CM | POA: Diagnosis not present

## 2021-10-06 DIAGNOSIS — I129 Hypertensive chronic kidney disease with stage 1 through stage 4 chronic kidney disease, or unspecified chronic kidney disease: Secondary | ICD-10-CM | POA: Diagnosis not present

## 2021-10-06 DIAGNOSIS — N2581 Secondary hyperparathyroidism of renal origin: Secondary | ICD-10-CM | POA: Diagnosis not present

## 2021-10-06 DIAGNOSIS — N041 Nephrotic syndrome with focal and segmental glomerular lesions: Secondary | ICD-10-CM | POA: Diagnosis not present

## 2021-10-06 DIAGNOSIS — R809 Proteinuria, unspecified: Secondary | ICD-10-CM | POA: Diagnosis not present

## 2021-10-06 DIAGNOSIS — N183 Chronic kidney disease, stage 3 unspecified: Secondary | ICD-10-CM | POA: Diagnosis not present

## 2021-11-30 DIAGNOSIS — M064 Inflammatory polyarthropathy: Secondary | ICD-10-CM | POA: Diagnosis not present

## 2021-11-30 DIAGNOSIS — M1A09X0 Idiopathic chronic gout, multiple sites, without tophus (tophi): Secondary | ICD-10-CM | POA: Diagnosis not present

## 2021-11-30 DIAGNOSIS — R808 Other proteinuria: Secondary | ICD-10-CM | POA: Diagnosis not present

## 2022-01-02 DIAGNOSIS — I129 Hypertensive chronic kidney disease with stage 1 through stage 4 chronic kidney disease, or unspecified chronic kidney disease: Secondary | ICD-10-CM | POA: Diagnosis not present

## 2022-01-02 DIAGNOSIS — N183 Chronic kidney disease, stage 3 unspecified: Secondary | ICD-10-CM | POA: Diagnosis not present

## 2022-01-02 DIAGNOSIS — R809 Proteinuria, unspecified: Secondary | ICD-10-CM | POA: Diagnosis not present

## 2022-01-02 DIAGNOSIS — N041 Nephrotic syndrome with focal and segmental glomerular lesions: Secondary | ICD-10-CM | POA: Diagnosis not present

## 2022-01-02 DIAGNOSIS — N2581 Secondary hyperparathyroidism of renal origin: Secondary | ICD-10-CM | POA: Diagnosis not present

## 2022-03-30 DIAGNOSIS — M064 Inflammatory polyarthropathy: Secondary | ICD-10-CM | POA: Diagnosis not present

## 2022-03-30 DIAGNOSIS — N1832 Chronic kidney disease, stage 3b: Secondary | ICD-10-CM | POA: Diagnosis not present

## 2022-03-30 DIAGNOSIS — R808 Other proteinuria: Secondary | ICD-10-CM | POA: Diagnosis not present

## 2022-03-30 DIAGNOSIS — M1A09X0 Idiopathic chronic gout, multiple sites, without tophus (tophi): Secondary | ICD-10-CM | POA: Diagnosis not present

## 2022-04-25 DIAGNOSIS — M109 Gout, unspecified: Secondary | ICD-10-CM | POA: Diagnosis not present

## 2022-04-25 DIAGNOSIS — I129 Hypertensive chronic kidney disease with stage 1 through stage 4 chronic kidney disease, or unspecified chronic kidney disease: Secondary | ICD-10-CM | POA: Diagnosis not present

## 2022-04-25 DIAGNOSIS — D631 Anemia in chronic kidney disease: Secondary | ICD-10-CM | POA: Diagnosis not present

## 2022-04-25 DIAGNOSIS — N183 Chronic kidney disease, stage 3 unspecified: Secondary | ICD-10-CM | POA: Diagnosis not present

## 2022-04-25 DIAGNOSIS — R809 Proteinuria, unspecified: Secondary | ICD-10-CM | POA: Diagnosis not present

## 2022-04-25 DIAGNOSIS — N189 Chronic kidney disease, unspecified: Secondary | ICD-10-CM | POA: Diagnosis not present

## 2022-07-19 DIAGNOSIS — N2581 Secondary hyperparathyroidism of renal origin: Secondary | ICD-10-CM | POA: Diagnosis not present

## 2022-07-19 DIAGNOSIS — R809 Proteinuria, unspecified: Secondary | ICD-10-CM | POA: Diagnosis not present

## 2022-07-19 DIAGNOSIS — N183 Chronic kidney disease, stage 3 unspecified: Secondary | ICD-10-CM | POA: Diagnosis not present

## 2022-07-19 DIAGNOSIS — I129 Hypertensive chronic kidney disease with stage 1 through stage 4 chronic kidney disease, or unspecified chronic kidney disease: Secondary | ICD-10-CM | POA: Diagnosis not present

## 2022-07-19 DIAGNOSIS — N041 Nephrotic syndrome with focal and segmental glomerular lesions: Secondary | ICD-10-CM | POA: Diagnosis not present

## 2022-08-17 DIAGNOSIS — N183 Chronic kidney disease, stage 3 unspecified: Secondary | ICD-10-CM | POA: Diagnosis not present

## 2022-08-30 DIAGNOSIS — Z125 Encounter for screening for malignant neoplasm of prostate: Secondary | ICD-10-CM | POA: Diagnosis not present

## 2022-08-30 DIAGNOSIS — N1832 Chronic kidney disease, stage 3b: Secondary | ICD-10-CM | POA: Diagnosis not present

## 2022-08-30 DIAGNOSIS — I129 Hypertensive chronic kidney disease with stage 1 through stage 4 chronic kidney disease, or unspecified chronic kidney disease: Secondary | ICD-10-CM | POA: Diagnosis not present

## 2022-08-30 DIAGNOSIS — E785 Hyperlipidemia, unspecified: Secondary | ICD-10-CM | POA: Diagnosis not present

## 2022-10-13 DIAGNOSIS — M1A09X0 Idiopathic chronic gout, multiple sites, without tophus (tophi): Secondary | ICD-10-CM | POA: Diagnosis not present

## 2022-10-13 DIAGNOSIS — M064 Inflammatory polyarthropathy: Secondary | ICD-10-CM | POA: Diagnosis not present

## 2022-10-13 DIAGNOSIS — R808 Other proteinuria: Secondary | ICD-10-CM | POA: Diagnosis not present

## 2022-10-13 DIAGNOSIS — N1832 Chronic kidney disease, stage 3b: Secondary | ICD-10-CM | POA: Diagnosis not present

## 2022-10-25 DIAGNOSIS — N041 Nephrotic syndrome with focal and segmental glomerular lesions: Secondary | ICD-10-CM | POA: Diagnosis not present

## 2022-10-25 DIAGNOSIS — I10 Essential (primary) hypertension: Secondary | ICD-10-CM | POA: Diagnosis not present

## 2022-10-25 DIAGNOSIS — R809 Proteinuria, unspecified: Secondary | ICD-10-CM | POA: Diagnosis not present

## 2022-10-25 DIAGNOSIS — N189 Chronic kidney disease, unspecified: Secondary | ICD-10-CM | POA: Diagnosis not present

## 2022-10-25 DIAGNOSIS — D631 Anemia in chronic kidney disease: Secondary | ICD-10-CM | POA: Diagnosis not present

## 2022-10-25 DIAGNOSIS — N184 Chronic kidney disease, stage 4 (severe): Secondary | ICD-10-CM | POA: Diagnosis not present

## 2022-10-25 DIAGNOSIS — N2581 Secondary hyperparathyroidism of renal origin: Secondary | ICD-10-CM | POA: Diagnosis not present

## 2022-12-05 DIAGNOSIS — N184 Chronic kidney disease, stage 4 (severe): Secondary | ICD-10-CM | POA: Diagnosis not present

## 2022-12-05 DIAGNOSIS — I129 Hypertensive chronic kidney disease with stage 1 through stage 4 chronic kidney disease, or unspecified chronic kidney disease: Secondary | ICD-10-CM | POA: Diagnosis not present

## 2022-12-05 DIAGNOSIS — R3129 Other microscopic hematuria: Secondary | ICD-10-CM | POA: Diagnosis not present

## 2022-12-05 DIAGNOSIS — R801 Persistent proteinuria, unspecified: Secondary | ICD-10-CM | POA: Diagnosis not present

## 2022-12-19 DIAGNOSIS — Z86010 Personal history of colonic polyps: Secondary | ICD-10-CM | POA: Diagnosis not present

## 2022-12-19 DIAGNOSIS — K5730 Diverticulosis of large intestine without perforation or abscess without bleeding: Secondary | ICD-10-CM | POA: Diagnosis not present

## 2022-12-19 DIAGNOSIS — K648 Other hemorrhoids: Secondary | ICD-10-CM | POA: Diagnosis not present

## 2023-03-08 DIAGNOSIS — R801 Persistent proteinuria, unspecified: Secondary | ICD-10-CM | POA: Diagnosis not present

## 2023-03-08 DIAGNOSIS — N051 Unspecified nephritic syndrome with focal and segmental glomerular lesions: Secondary | ICD-10-CM | POA: Diagnosis not present

## 2023-03-13 DIAGNOSIS — M898X9 Other specified disorders of bone, unspecified site: Secondary | ICD-10-CM | POA: Diagnosis not present

## 2023-03-13 DIAGNOSIS — E559 Vitamin D deficiency, unspecified: Secondary | ICD-10-CM | POA: Diagnosis not present

## 2023-03-13 DIAGNOSIS — N051 Unspecified nephritic syndrome with focal and segmental glomerular lesions: Secondary | ICD-10-CM | POA: Diagnosis not present

## 2023-03-13 DIAGNOSIS — N184 Chronic kidney disease, stage 4 (severe): Secondary | ICD-10-CM | POA: Diagnosis not present

## 2023-07-16 DIAGNOSIS — N1832 Chronic kidney disease, stage 3b: Secondary | ICD-10-CM | POA: Diagnosis not present

## 2023-07-16 DIAGNOSIS — R808 Other proteinuria: Secondary | ICD-10-CM | POA: Diagnosis not present

## 2023-07-16 DIAGNOSIS — M064 Inflammatory polyarthropathy: Secondary | ICD-10-CM | POA: Diagnosis not present

## 2023-07-16 DIAGNOSIS — M1A09X0 Idiopathic chronic gout, multiple sites, without tophus (tophi): Secondary | ICD-10-CM | POA: Diagnosis not present

## 2023-08-08 DIAGNOSIS — L820 Inflamed seborrheic keratosis: Secondary | ICD-10-CM | POA: Diagnosis not present

## 2023-08-08 DIAGNOSIS — D485 Neoplasm of uncertain behavior of skin: Secondary | ICD-10-CM | POA: Diagnosis not present

## 2023-08-08 DIAGNOSIS — L814 Other melanin hyperpigmentation: Secondary | ICD-10-CM | POA: Diagnosis not present

## 2023-08-08 DIAGNOSIS — L821 Other seborrheic keratosis: Secondary | ICD-10-CM | POA: Diagnosis not present

## 2023-08-08 DIAGNOSIS — D225 Melanocytic nevi of trunk: Secondary | ICD-10-CM | POA: Diagnosis not present

## 2023-08-08 DIAGNOSIS — Z85828 Personal history of other malignant neoplasm of skin: Secondary | ICD-10-CM | POA: Diagnosis not present

## 2023-08-20 DIAGNOSIS — D631 Anemia in chronic kidney disease: Secondary | ICD-10-CM | POA: Diagnosis not present

## 2023-08-20 DIAGNOSIS — E559 Vitamin D deficiency, unspecified: Secondary | ICD-10-CM | POA: Diagnosis not present

## 2023-08-20 DIAGNOSIS — N184 Chronic kidney disease, stage 4 (severe): Secondary | ICD-10-CM | POA: Diagnosis not present

## 2023-08-20 DIAGNOSIS — M898X9 Other specified disorders of bone, unspecified site: Secondary | ICD-10-CM | POA: Diagnosis not present

## 2023-08-20 DIAGNOSIS — I129 Hypertensive chronic kidney disease with stage 1 through stage 4 chronic kidney disease, or unspecified chronic kidney disease: Secondary | ICD-10-CM | POA: Diagnosis not present

## 2023-08-30 DIAGNOSIS — M898X9 Other specified disorders of bone, unspecified site: Secondary | ICD-10-CM | POA: Diagnosis not present

## 2023-08-30 DIAGNOSIS — N029 Recurrent and persistent hematuria with unspecified morphologic changes: Secondary | ICD-10-CM | POA: Diagnosis not present

## 2023-08-30 DIAGNOSIS — N184 Chronic kidney disease, stage 4 (severe): Secondary | ICD-10-CM | POA: Diagnosis not present

## 2023-08-30 DIAGNOSIS — N051 Unspecified nephritic syndrome with focal and segmental glomerular lesions: Secondary | ICD-10-CM | POA: Diagnosis not present

## 2023-10-01 DIAGNOSIS — J029 Acute pharyngitis, unspecified: Secondary | ICD-10-CM | POA: Diagnosis not present

## 2023-10-01 DIAGNOSIS — J069 Acute upper respiratory infection, unspecified: Secondary | ICD-10-CM | POA: Diagnosis not present

## 2023-11-03 DIAGNOSIS — J069 Acute upper respiratory infection, unspecified: Secondary | ICD-10-CM | POA: Diagnosis not present

## 2024-03-04 DIAGNOSIS — N184 Chronic kidney disease, stage 4 (severe): Secondary | ICD-10-CM | POA: Diagnosis not present

## 2024-03-04 DIAGNOSIS — N051 Unspecified nephritic syndrome with focal and segmental glomerular lesions: Secondary | ICD-10-CM | POA: Diagnosis not present

## 2024-03-10 DIAGNOSIS — M1A39X0 Chronic gout due to renal impairment, multiple sites, without tophus (tophi): Secondary | ICD-10-CM | POA: Diagnosis not present

## 2024-03-10 DIAGNOSIS — N051 Unspecified nephritic syndrome with focal and segmental glomerular lesions: Secondary | ICD-10-CM | POA: Diagnosis not present

## 2024-03-10 DIAGNOSIS — M898X9 Other specified disorders of bone, unspecified site: Secondary | ICD-10-CM | POA: Diagnosis not present

## 2024-03-10 DIAGNOSIS — N184 Chronic kidney disease, stage 4 (severe): Secondary | ICD-10-CM | POA: Diagnosis not present

## 2024-06-27 DIAGNOSIS — N051 Unspecified nephritic syndrome with focal and segmental glomerular lesions: Secondary | ICD-10-CM | POA: Diagnosis not present

## 2024-06-27 DIAGNOSIS — N184 Chronic kidney disease, stage 4 (severe): Secondary | ICD-10-CM | POA: Diagnosis not present

## 2024-07-01 DIAGNOSIS — N184 Chronic kidney disease, stage 4 (severe): Secondary | ICD-10-CM | POA: Diagnosis not present

## 2024-07-01 DIAGNOSIS — M898X9 Other specified disorders of bone, unspecified site: Secondary | ICD-10-CM | POA: Diagnosis not present

## 2024-07-01 DIAGNOSIS — N029 Recurrent and persistent hematuria with unspecified morphologic changes: Secondary | ICD-10-CM | POA: Diagnosis not present

## 2024-07-01 DIAGNOSIS — N051 Unspecified nephritic syndrome with focal and segmental glomerular lesions: Secondary | ICD-10-CM | POA: Diagnosis not present

## 2024-07-15 DIAGNOSIS — Z79899 Other long term (current) drug therapy: Secondary | ICD-10-CM | POA: Diagnosis not present

## 2024-07-15 DIAGNOSIS — E785 Hyperlipidemia, unspecified: Secondary | ICD-10-CM | POA: Diagnosis not present

## 2024-07-15 DIAGNOSIS — N184 Chronic kidney disease, stage 4 (severe): Secondary | ICD-10-CM | POA: Diagnosis not present

## 2024-07-15 DIAGNOSIS — I129 Hypertensive chronic kidney disease with stage 1 through stage 4 chronic kidney disease, or unspecified chronic kidney disease: Secondary | ICD-10-CM | POA: Diagnosis not present
